# Patient Record
Sex: Female | Born: 1970 | Race: White | Hispanic: No | Marital: Single | Smoking: Current every day smoker
Health system: Southern US, Community
[De-identification: ages and names within clinical notes are randomized; demographics above are authoritative.]

## PROBLEM LIST (undated history)

## (undated) DIAGNOSIS — F319 Bipolar disorder, unspecified: Secondary | ICD-10-CM

## (undated) DIAGNOSIS — F329 Major depressive disorder, single episode, unspecified: Secondary | ICD-10-CM

## (undated) DIAGNOSIS — F32A Depression, unspecified: Secondary | ICD-10-CM

---

## 2014-10-21 ENCOUNTER — Emergency Department (HOSPITAL_COMMUNITY): Payer: Medicare Other

## 2014-10-21 ENCOUNTER — Encounter (HOSPITAL_COMMUNITY): Payer: Self-pay | Admitting: Family Medicine

## 2014-10-21 ENCOUNTER — Emergency Department (HOSPITAL_COMMUNITY)
Admission: EM | Admit: 2014-10-21 | Discharge: 2014-10-21 | Disposition: A | Discharge status: Home / Self Care | Payer: Medicare Other | Attending: Emergency Medicine | Admitting: Emergency Medicine

## 2014-10-21 DIAGNOSIS — Y9289 Other specified places as the place of occurrence of the external cause: Secondary | ICD-10-CM | POA: Diagnosis not present

## 2014-10-21 DIAGNOSIS — Z3202 Encounter for pregnancy test, result negative: Secondary | ICD-10-CM | POA: Diagnosis not present

## 2014-10-21 DIAGNOSIS — F319 Bipolar disorder, unspecified: Secondary | ICD-10-CM | POA: Insufficient documentation

## 2014-10-21 DIAGNOSIS — Y9389 Activity, other specified: Secondary | ICD-10-CM | POA: Diagnosis not present

## 2014-10-21 DIAGNOSIS — K8020 Calculus of gallbladder without cholecystitis without obstruction: Secondary | ICD-10-CM

## 2014-10-21 DIAGNOSIS — Z720 Tobacco use: Secondary | ICD-10-CM | POA: Diagnosis not present

## 2014-10-21 DIAGNOSIS — R21 Rash and other nonspecific skin eruption: Secondary | ICD-10-CM | POA: Diagnosis not present

## 2014-10-21 DIAGNOSIS — Y998 Other external cause status: Secondary | ICD-10-CM | POA: Diagnosis not present

## 2014-10-21 DIAGNOSIS — W2209XA Striking against other stationary object, initial encounter: Secondary | ICD-10-CM | POA: Insufficient documentation

## 2014-10-21 DIAGNOSIS — S6991XA Unspecified injury of right wrist, hand and finger(s), initial encounter: Secondary | ICD-10-CM | POA: Diagnosis present

## 2014-10-21 HISTORY — DX: Depression, unspecified: F32.A

## 2014-10-21 HISTORY — DX: Major depressive disorder, single episode, unspecified: F32.9

## 2014-10-21 HISTORY — DX: Bipolar disorder, unspecified: F31.9

## 2014-10-21 LAB — COMPREHENSIVE METABOLIC PANEL
ALT: 14 U/L (ref 14–54)
ANION GAP: 7 (ref 5–15)
AST: 17 U/L (ref 15–41)
Albumin: 3.7 g/dL (ref 3.5–5.0)
Alkaline Phosphatase: 84 U/L (ref 38–126)
BILIRUBIN TOTAL: 0.7 mg/dL (ref 0.3–1.2)
BUN: 9 mg/dL (ref 6–20)
CALCIUM: 9.3 mg/dL (ref 8.9–10.3)
CHLORIDE: 104 mmol/L (ref 101–111)
CO2: 26 mmol/L (ref 22–32)
Creatinine, Ser: 0.71 mg/dL (ref 0.44–1.00)
GFR calc Af Amer: >60 mL/min (ref >60)
Glucose, Bld: 102 mg/dL — ABNORMAL HIGH (ref 65–99)
Potassium: 4.3 mmol/L (ref 3.5–5.1)
Sodium: 137 mmol/L (ref 135–145)
Total Protein: 7.6 g/dL (ref 6.5–8.1)

## 2014-10-21 LAB — URINALYSIS, ROUTINE W REFLEX MICROSCOPIC
Glucose, UA: NEGATIVE mg/dL
Hgb urine dipstick: NEGATIVE
KETONES UR: NEGATIVE mg/dL
Leukocytes, UA: NEGATIVE
NITRITE: NEGATIVE
PH: 5.5 (ref 5.0–8.0)
Protein, ur: NEGATIVE mg/dL
Specific Gravity, Urine: 1.023 (ref 1.005–1.030)
Urobilinogen, UA: 0.2 mg/dL (ref 0.0–1.0)

## 2014-10-21 LAB — CBC WITH DIFFERENTIAL/PLATELET
BASOS ABS: 0 10*3/uL (ref 0.0–0.1)
Basophils Relative: 0 % (ref 0–1)
EOS ABS: 0.5 10*3/uL (ref 0.0–0.7)
EOS PCT: 5 % (ref 0–5)
HCT: 44 % (ref 36.0–46.0)
Hemoglobin: 14.6 g/dL (ref 12.0–15.0)
LYMPHS PCT: 32 % (ref 12–46)
Lymphs Abs: 3.5 10*3/uL (ref 0.7–4.0)
MCH: 27.6 pg (ref 26.0–34.0)
MCHC: 33.2 g/dL (ref 30.0–36.0)
MCV: 83.2 fL (ref 78.0–100.0)
MONOS PCT: 7 % (ref 3–12)
Monocytes Absolute: 0.7 10*3/uL (ref 0.1–1.0)
NEUTROS ABS: 6.1 10*3/uL (ref 1.7–7.7)
Neutrophils Relative %: 56 % (ref 43–77)
Platelets: 320 10*3/uL (ref 150–400)
RBC: 5.29 MIL/uL — ABNORMAL HIGH (ref 3.87–5.11)
RDW: 14.7 % (ref 11.5–15.5)
WBC: 10.9 10*3/uL — AB (ref 4.0–10.5)

## 2014-10-21 LAB — LIPASE, BLOOD: Lipase: 24 U/L (ref 22–51)

## 2014-10-21 LAB — PREGNANCY, URINE: Preg Test, Ur: NEGATIVE

## 2014-10-21 LAB — AMYLASE: Amylase: 66 U/L (ref 28–100)

## 2014-10-21 MED ORDER — OXYCODONE-ACETAMINOPHEN 5-325 MG PO TABS: 1.0000 | ORAL_TABLET | Freq: Four times a day (QID) | ORAL | Status: AC | PRN

## 2014-10-21 MED ORDER — ONDANSETRON 4 MG PO TBDP
4.0000 mg | ORAL_TABLET | Freq: Once | ORAL | Status: AC
  Administered 2014-10-21: 4 mg via ORAL
  Filled 2014-10-21: qty 1

## 2014-10-21 MED ORDER — KETOROLAC TROMETHAMINE 30 MG/ML IJ SOLN
30.0000 mg | Freq: Once | INTRAMUSCULAR | Status: AC
  Administered 2014-10-21: 30 mg via INTRAVENOUS
  Filled 2014-10-21: qty 1

## 2014-10-21 MED ORDER — ONDANSETRON HCL 4 MG PO TABS: 4.0000 mg | ORAL_TABLET | Freq: Three times a day (TID) | ORAL | Status: AC | PRN

## 2014-10-21 MED ORDER — ONDANSETRON HCL 4 MG PO TABS: 4.0000 mg | ORAL_TABLET | Freq: Three times a day (TID) | ORAL | Status: DC | PRN

## 2014-10-21 NOTE — Discharge Instructions (Signed)
Thank you for allowing [place] to be involved in your healthcare while you were hospitalized at [facility].   Please note that there have been changes to your home medications.  --> PLEASE LOOK AT YOUR DISCHARGE MEDICATION LIST FOR DETAILS.  Please call your PCP if you have any questions or concerns, or any difficulty getting any of your medications.  Please return to the ER if you have worsening of your symptoms or new severe symptoms arise.   YOU HAVE GALLSTONES. - FOLLOW UP WITH [facility] AS LISTED IN THIS PACKET TO DISCUSS SURGERY OPTIONS - TAKE ZOFRAN FOR NAUSEA - TAKE PERCOCET FOR PAIN  Cholelithiasis Cholelithiasis (also called gallstones) is a form of gallbladder disease in which gallstones form in your gallbladder. The gallbladder is an organ that stores bile made in the liver, which helps digest fats. Gallstones begin as small crystals and slowly grow into stones. Gallstone pain occurs when the gallbladder spasms and a gallstone is blocking the duct. Pain can also occur when a stone passes out of the duct.  RISK FACTORS  Being female.   Having multiple pregnancies. Health care providers sometimes advise removing diseased gallbladders before future pregnancies.   Being obese.  Eating a diet heavy in fried foods and fat.   Being older than 60 years and increasing age.   Prolonged use of medicines containing female hormones.   Having diabetes mellitus.   Rapidly losing weight.   Having a family history of gallstones (heredity).  SYMPTOMS  Nausea.   Vomiting.  Abdominal pain.   Yellowing of the skin (jaundice).   Sudden pain. It may persist from several minutes to several hours.  Fever.   Tenderness to the touch. In some cases, when gallstones do not move into the bile duct, people have no pain or symptoms. These are called "silent" gallstones.  TREATMENT Silent gallstones do not need treatment. In severe cases,  emergency surgery may be required. Options for treatment include:  Surgery to remove the gallbladder. This is the most common treatment.  Medicines. These do not always work and may take 6-12 months or more to work.  Shock wave treatment (extracorporeal biliary lithotripsy). In this treatment an ultrasound machine sends shock waves to the gallbladder to break gallstones into smaller pieces that can pass into the intestines or be dissolved by medicine. HOME CARE INSTRUCTIONS   Only take over-the-counter or prescription medicines for pain, discomfort, or fever as directed by your health care provider.   Follow a low-fat diet until seen again by your health care provider. Fat causes the gallbladder to contract, which can result in pain.   Follow up with your health care provider as directed. Attacks are almost always recurrent and surgery is usually required for permanent treatment.  SEEK IMMEDIATE MEDICAL CARE IF:   Your pain increases and is not controlled by medicines.   You have a fever or persistent symptoms for more than 2-3 days.   You have a fever and your symptoms suddenly get worse.   You have persistent nausea and vomiting.  MAKE SURE YOU:   Understand these instructions.  Will watch your condition.  Will get help right away if you are not doing well or get worse. Document Released: 04/15/2005 Document Revised: 12/20/2012 Document Reviewed: 10/11/2012 [facility] Patient Information 2015 [place], [place]. This information is not intended to replace advice given to you by your health care provider. Make sure you discuss any questions you have with your health care  provider.
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
